# Patient Record
Sex: Female | Born: 1980 | Race: White | Hispanic: No | Marital: Single | Smoking: Current every day smoker
Health system: Southern US, Community
[De-identification: ages and names within clinical notes are randomized; demographics above are authoritative.]

## PROBLEM LIST (undated history)

## (undated) DIAGNOSIS — I10 Essential (primary) hypertension: Secondary | ICD-10-CM

## (undated) DIAGNOSIS — E785 Hyperlipidemia, unspecified: Secondary | ICD-10-CM

## (undated) HISTORY — DX: Essential (primary) hypertension: I10

## (undated) HISTORY — DX: Hyperlipidemia, unspecified: E78.5

## (undated) HISTORY — PX: COLON SURGERY: SHX602

---

## 2015-09-27 ENCOUNTER — Encounter: Payer: Self-pay | Admitting: Endocrinology

## 2016-04-03 ENCOUNTER — Encounter: Payer: Self-pay | Admitting: Endocrinology

## 2017-04-09 ENCOUNTER — Encounter: Payer: Self-pay | Admitting: Family Medicine

## 2017-04-09 ENCOUNTER — Ambulatory Visit (INDEPENDENT_AMBULATORY_CARE_PROVIDER_SITE_OTHER): Payer: BLUE CROSS/BLUE SHIELD | Admitting: Family Medicine

## 2017-04-09 VITALS — BP 128/68 | HR 89 | Temp 98.3°F | Resp 16 | Ht 62.75 in | Wt 213.4 lb

## 2017-04-09 DIAGNOSIS — N898 Other specified noninflammatory disorders of vagina: Secondary | ICD-10-CM | POA: Diagnosis not present

## 2017-04-09 DIAGNOSIS — Z6838 Body mass index (BMI) 38.0-38.9, adult: Secondary | ICD-10-CM | POA: Diagnosis not present

## 2017-04-09 DIAGNOSIS — Z131 Encounter for screening for diabetes mellitus: Secondary | ICD-10-CM | POA: Diagnosis not present

## 2017-04-09 DIAGNOSIS — Z124 Encounter for screening for malignant neoplasm of cervix: Secondary | ICD-10-CM

## 2017-04-09 DIAGNOSIS — E782 Mixed hyperlipidemia: Secondary | ICD-10-CM | POA: Diagnosis not present

## 2017-04-09 DIAGNOSIS — R5382 Chronic fatigue, unspecified: Secondary | ICD-10-CM

## 2017-04-09 DIAGNOSIS — E041 Nontoxic single thyroid nodule: Secondary | ICD-10-CM

## 2017-04-09 DIAGNOSIS — Z0000 Encounter for general adult medical examination without abnormal findings: Secondary | ICD-10-CM

## 2017-04-09 LAB — POCT WET + KOH PREP
Trich by wet prep: ABSENT
YEAST BY KOH: ABSENT
Yeast by wet prep: ABSENT

## 2017-04-09 MED ORDER — HYDROCHLOROTHIAZIDE 25 MG PO TABS: 25.0000 mg | ORAL_TABLET | Freq: Every day | ORAL | 1 refills | Status: DC

## 2017-04-09 MED ORDER — ROSUVASTATIN CALCIUM 20 MG PO TABS: 20.0000 mg | ORAL_TABLET | Freq: Every day | ORAL | 1 refills | Status: DC

## 2017-04-09 MED ORDER — CARVEDILOL 3.125 MG PO TABS: 3.1250 mg | ORAL_TABLET | Freq: Two times a day (BID) | ORAL | 90 refills | Status: DC

## 2017-04-09 MED ORDER — CETIRIZINE HCL 10 MG PO TABS: 10.0000 mg | ORAL_TABLET | Freq: Every day | ORAL | 3 refills | Status: AC

## 2017-04-09 NOTE — Patient Instructions (Signed)
     IF you received an x-ray today, you will receive an invoice from [facility]. Please contact [facility] at [phone] with questions or concerns regarding your invoice.   IF you received labwork today, you will receive an invoice from [facility]. Please contact [facility] at [phone] with questions or concerns regarding your invoice.   Our billing staff will not be able to assist you with questions regarding bills from these companies.  You will be contacted with the lab results as soon as they are available. The fastest way to get your results is to activate your My Chart account. Instructions are located on the last page of this paperwork. If you have not heard from us regarding the results in 2 weeks, please contact this office.    We recommend that you schedule a mammogram for breast cancer screening. Typically, you do not need a referral to do this. Please contact a local imaging center to schedule your mammogram.  [facility] - [phone]  *ask for the Radiology Department [facility] - [phone] or [phone]  [facility] - [phone] [facility] - [phone] [facility] - [phone]  *ask for the Radiology Department [facility] - [phone]  *ask for the Radiology Department [facility] - [phone]  *ask for the Mammography Department [facility] - [phone] 

## 2017-04-09 NOTE — Progress Notes (Signed)
Chief Complaint  Patient presents with  . Annual Exam    with pap    Subjective:  Monique Bowers is a 36 y.o. female here for a health maintenance visit.  Patient is new pt   Patient has a history of a thyroid nodule  She had annual [place]  She also had 2 biopsies from endocrinology She reports fatigue and heat intolerance as well as cold intolerance Her fatigue is not severe She reports that she gets tired monthly for about a week Patient's last menstrual period was 03/28/2017. She gets regular periods lasting 4-5 days She reports that her sexual partner was not monogamous so she wants a repeat pap [facility]   Patient Active Problem List   Diagnosis Date Noted  . Thyroid nodule 04/09/2017    Past Medical History:  Diagnosis Date  . Hyperlipidemia   . Hypertension     Past Surgical History:  Procedure Laterality Date  . CESAREAN SECTION    . COLON SURGERY       No outpatient prescriptions prior to visit.   No facility-administered medications prior to visit.     No Known Allergies   Family History  Problem Relation Age of Onset  . Hyperlipidemia Father   . Hypertension Father   . Gout Sister   . Diabetes Maternal Grandmother   . Hypertension Maternal Grandmother   . Stroke Maternal Grandfather   . Diabetes Paternal Grandmother   . Hypertension Paternal Grandmother   . Heart disease Paternal Grandfather      Health Habits: Dental Exam: up to date Eye Exam: up to date Exercise: 2 times/week on average Current exercise activities: walking/running Diet:   Social History   Social History  . Marital status: Single    Spouse name: N/A  . Number of children: N/A  . Years of education: N/A   Occupational History  . Not on file.   Social History Main Topics  . Smoking status: Current Every Day Smoker    Packs/day: 0.50    Years: 20.00    Types: Cigarettes  . Smokeless tobacco: Never Used  .  Alcohol use 0.6 oz/week    1 Glasses of wine per week  . Drug use: No  . Sexual activity: Not Currently   Other Topics Concern  . Not on file   Social History Narrative  . No narrative on file   History  Alcohol Use  . 0.6 oz/week  . 1 Glasses of wine per week   History  Smoking Status  . Current Every Day Smoker  . Packs/day: 0.50  . Years: 20.00  . Types: Cigarettes  Smokeless Tobacco  . Never Used   History  Drug Use No    GYN: Sexual Health Menstrual status: regular menses LMP: Patient's last menstrual period was 03/28/2017. Last pap smear: see HM section History of abnormal pap smears: none Sexually active: with female partner Current contraception: abstinence, condoms with intercourse  Health Maintenance: See under health Maintenance activity for review of completion dates as well.  There is no immunization history on file for this patient.    Depression Screen-PHQ2/9 Depression screen [facility] 2/9 04/09/2017  Decreased Interest 0  Down, Depressed, Hopeless 0  PHQ - 2 Score 0       Depression Severity and Treatment Recommendations:  0-4= None  5-9= Mild / Treatment: Support, educate to call if worse; return in one month  10-14= Moderate / Treatment: Support,  watchful waiting; Antidepressant or Psycotherapy  15-19= Moderately severe / Treatment: Antidepressant OR Psychotherapy  >= 20 = Major depression, severe / Antidepressant AND Psychotherapy    Review of Systems   Review of Systems  Constitutional: Positive for malaise/fatigue.  Respiratory: Negative for cough and hemoptysis.   Cardiovascular: Negative for chest pain and palpitations.  Gastrointestinal: Negative for abdominal pain, nausea and vomiting.  Genitourinary: Negative for dysuria, frequency and urgency.  Skin: Negative for itching and rash.  Neurological: Positive for headaches. Negative for dizziness, sensory change and seizures.  Endo/Heme/Allergies:       +fatigue, cold  intolerance, heat intolerance    See HPI for ROS as well.    Objective:   Vitals:   04/09/17 0830  BP: 128/68  Pulse: 89  Resp: 16  Temp: 98.3 F (36.8 C)  TempSrc: Oral  SpO2: 97%  Weight: 213 lb 6.4 oz (96.8 kg)  Height: 5' 2.75" (1.594 m)    Body mass index is 38.1 kg/m.  Physical Exam  Constitutional: She is oriented to person, place, and time. She appears well-developed and well-nourished.  HENT:  Head: Normocephalic and atraumatic.  Eyes: Conjunctivae and EOM are normal.  Neck: Normal range of motion. Neck supple. Thyromegaly present.  Left side thyroid gland with nodule  Cardiovascular: Normal rate, regular rhythm and normal heart sounds.   Pulmonary/Chest: Effort normal and breath sounds normal. No respiratory distress. She has no wheezes.  Abdominal: Soft. Bowel sounds are normal. She exhibits no distension. There is no tenderness. There is no rebound.  Genitourinary: Vagina normal.  Musculoskeletal: Normal range of motion. She exhibits no edema or tenderness.  Neurological: She is alert and oriented to person, place, and time. She has normal reflexes. No cranial nerve deficit.  Skin: Skin is warm. No erythema.  Psychiatric: She has a normal mood and affect. Her behavior is normal. Judgment and thought content normal.    Vaginal exam Labia normal bilaterally without skin lesions Urethral meatus normal appearing without erythema Vagina with white discharge No CMT, ovaries small and not palpable Uterus midline, nontender Pap smear performed Chaperone present   Assessment/Plan:   Patient was seen for a health maintenance exam.  Counseled the patient on health maintenance issues. Reviewed her health mainteance schedule and ordered appropriate tests (see orders.) Counseled on regular exercise and weight management. Recommend regular eye exams and dental cleaning.   The following issues were addressed today for health maintenance:   Monique Bowers was seen today  for annual exam.  Diagnoses and all orders for this visit:  Encounter for health maintenance examination in adult- age appropriate screenings reviewed  Pap smear for cervical cancer screening -     Pap IG and HPV (high risk) DNA detection  Thyroid nodule- will monitor and refer -     Ambulatory referral to Endocrinology -     TSH + free T4  Class 2 severe obesity due to excess calories with serious comorbidity and body mass index (BMI) of 38.0 to 38.9 in adult (HCC) -     Comprehensive metabolic panel -     TSH + free T4 -     Hemoglobin A1c  Chronic fatigue- will assess for underlying causes -     TSH + free T4 -     CBC  Mixed hyperlipidemia -     Comprehensive metabolic panel -     Lipid panel  Screening for diabetes mellitus -     Hemoglobin A1c  Vaginal discharge- physiologic discharge.  No yeast or clue cells -     POCT Wet + KOH Prep  Other orders -     rosuvastatin (CRESTOR) 20 MG tablet; Take 1 tablet (20 mg total) by mouth daily. -     hydrochlorothiazide (HYDRODIURIL) 25 MG tablet; Take 1 tablet (25 mg total) by mouth daily. -     cetirizine (ZYRTEC) 10 MG tablet; Take 1 tablet (10 mg total) by mouth daily. -     carvedilol (COREG) 3.125 MG tablet; Take 1 tablet (3.125 mg total) by mouth 2 (two) times daily with a meal.    Return in about 1 year (around 04/09/2018).    Body mass index is 38.1 kg/m.:  Discussed the patient's BMI with patient. The BMI body mass index is 38.1 kg/m.     No future appointments.  Patient Instructions       IF you received an x-ray today, you will receive an invoice from [facility]. Please contact [facility] at [phone] with questions or concerns regarding your invoice.   IF you received labwork today, you will receive an invoice from [facility]. Please contact [facility] at [phone] with questions or concerns regarding your invoice.   Our billing staff will not be able to assist you with  questions regarding bills from these companies.  You will be contacted with the lab results as soon as they are available. The fastest way to get your results is to activate your My Chart account. Instructions are located on the last page of this paperwork. If you have not heard from us regarding the results in 2 weeks, please contact this office.    We recommend that you schedule a mammogram for breast cancer screening. Typically, you do not need a referral to do this. Please contact a local imaging center to schedule your mammogram.  [facility] - [phone]  *ask for the Radiology Department [facility] - [phone] or [phone]  [facility] - [phone] [facility] - [phone] [facility] - [phone]  *ask for the Radiology Department [facility] - [phone]  *ask for the Radiology Department [facility] - [phone]  *ask for the Mammography Department [facility] - [phone]

## 2017-04-10 LAB — COMPREHENSIVE METABOLIC PANEL
ALBUMIN: 4.5 g/dL (ref 3.5–5.5)
ALK PHOS: 53 IU/L (ref 39–117)
ALT: 28 IU/L (ref 0–32)
AST: 22 IU/L (ref 0–40)
Albumin/Globulin Ratio: 1.7 (ref 1.2–2.2)
BILIRUBIN TOTAL: 0.3 mg/dL (ref 0.0–1.2)
BUN / CREAT RATIO: 20 (ref 9–23)
BUN: 12 mg/dL (ref 6–20)
CO2: 22 mmol/L (ref 20–29)
CREATININE: 0.59 mg/dL (ref 0.57–1.00)
Calcium: 9.8 mg/dL (ref 8.7–10.2)
Chloride: 102 mmol/L (ref 96–106)
GFR calc non Af Amer: 117 mL/min/{1.73_m2} (ref >59)
GFR, EST AFRICAN AMERICAN: 134 mL/min/{1.73_m2} (ref >59)
GLOBULIN, TOTAL: 2.6 g/dL (ref 1.5–4.5)
GLUCOSE: 104 mg/dL — AB (ref 65–99)
Potassium: 3.7 mmol/L (ref 3.5–5.2)
SODIUM: 142 mmol/L (ref 134–144)
TOTAL PROTEIN: 7.1 g/dL (ref 6.0–8.5)

## 2017-04-10 LAB — LIPID PANEL
CHOL/HDL RATIO: 5.6 ratio — AB (ref 0.0–4.4)
Cholesterol, Total: 195 mg/dL (ref 100–199)
HDL: 35 mg/dL — ABNORMAL LOW (ref >39)
LDL CALC: 114 mg/dL — AB (ref 0–99)
TRIGLYCERIDES: 231 mg/dL — AB (ref 0–149)
VLDL Cholesterol Cal: 46 mg/dL — ABNORMAL HIGH (ref 5–40)

## 2017-04-10 LAB — CBC
HEMATOCRIT: 41.3 % (ref 34.0–46.6)
HEMOGLOBIN: 14 g/dL (ref 11.1–15.9)
MCH: 31.3 pg (ref 26.6–33.0)
MCHC: 33.9 g/dL (ref 31.5–35.7)
MCV: 92 fL (ref 79–97)
Platelets: 343 10*3/uL (ref 150–379)
RBC: 4.48 x10E6/uL (ref 3.77–5.28)
RDW: 13.2 % (ref 12.3–15.4)
WBC: 9.7 10*3/uL (ref 3.4–10.8)

## 2017-04-10 LAB — TSH+FREE T4
Free T4: 0.87 ng/dL (ref 0.82–1.77)
TSH: 0.671 u[IU]/mL (ref 0.450–4.500)

## 2017-04-10 LAB — HEMOGLOBIN A1C
Est. average glucose Bld gHb Est-mCnc: 103 mg/dL
Hgb A1c MFr Bld: 5.2 % (ref 4.8–5.6)

## 2017-04-11 LAB — PAP IG AND HPV HIGH-RISK
HPV, HIGH-RISK: NEGATIVE
PAP Smear Comment: 0

## 2017-04-15 ENCOUNTER — Telehealth: Payer: Self-pay | Admitting: Family Medicine

## 2017-04-15 NOTE — Telephone Encounter (Signed)
PT CALLING ABOUT ABNORMAL PAP

## 2017-04-15 NOTE — Telephone Encounter (Signed)
Pt is looking for lab results   Best number [phone]

## 2017-04-16 NOTE — Telephone Encounter (Signed)
Please advise 

## 2017-04-17 NOTE — Telephone Encounter (Signed)
Discussed results with the patient

## 2017-05-08 ENCOUNTER — Encounter: Payer: Self-pay | Admitting: Emergency Medicine

## 2017-05-08 ENCOUNTER — Ambulatory Visit (INDEPENDENT_AMBULATORY_CARE_PROVIDER_SITE_OTHER): Payer: BLUE CROSS/BLUE SHIELD | Admitting: Emergency Medicine

## 2017-05-08 VITALS — BP 121/80 | HR 76 | Temp 98.3°F | Resp 16 | Ht 63.0 in | Wt 209.3 lb

## 2017-05-08 DIAGNOSIS — R350 Frequency of micturition: Secondary | ICD-10-CM | POA: Diagnosis not present

## 2017-05-08 DIAGNOSIS — R399 Unspecified symptoms and signs involving the genitourinary system: Secondary | ICD-10-CM

## 2017-05-08 LAB — POCT URINALYSIS DIP (MANUAL ENTRY)
Bilirubin, UA: NEGATIVE
GLUCOSE UA: NEGATIVE mg/dL
Ketones, POC UA: NEGATIVE mg/dL
Leukocytes, UA: NEGATIVE
NITRITE UA: NEGATIVE
PROTEIN UA: NEGATIVE mg/dL
RBC UA: NEGATIVE
Spec Grav, UA: <=1.005 — AB (ref 1.010–1.025)
UROBILINOGEN UA: 0.2 U/dL
pH, UA: 6.5 (ref 5.0–8.0)

## 2017-05-08 LAB — POC MICROSCOPIC URINALYSIS (UMFC): MUCUS RE: ABSENT

## 2017-05-08 MED ORDER — SULFAMETHOXAZOLE-TRIMETHOPRIM 800-160 MG PO TABS: 1.0000 | ORAL_TABLET | Freq: Two times a day (BID) | ORAL | 0 refills | Status: AC

## 2017-05-08 NOTE — Progress Notes (Signed)
Monique Bowers 36 y.o.   Chief Complaint  Patient presents with  . Urinary Frequency    WITH BURINING AT THE END x 1 week  . Back Pain    HISTORY OF PRESENT ILLNESS: This is a 36 y.o. female complaining of UTI symptoms x 1 week now getting nauseous with some right sided flank pain. Has been taking Azo and cranbery remedies.  Urinary Tract Infection   This is a new problem. The current episode started in the past 7 days. The problem occurs every urination. The problem has been gradually worsening. The quality of the pain is described as burning. The pain is at a severity of 3/10. The pain is mild. There has been no fever. There is no history of pyelonephritis. Associated symptoms include flank pain, frequency and nausea. Pertinent negatives include no chills, discharge, hematuria, possible pregnancy or vomiting. She has tried home medications for the symptoms. The treatment provided mild relief. There is no history of catheterization, kidney stones, recurrent UTIs or a urological procedure.     Prior to Admission medications   Medication Sig Start Date End Date Taking? Authorizing Provider  carvedilol (COREG) 3.125 MG tablet Take 1 tablet (3.125 mg total) by mouth 2 (two) times daily with a meal. 04/09/17  Yes Stallings, Zoe A, MD  cetirizine (ZYRTEC) 10 MG tablet Take 1 tablet (10 mg total) by mouth daily. 04/09/17  Yes Stallings, Zoe A, MD  hydrochlorothiazide (HYDRODIURIL) 25 MG tablet Take 1 tablet (25 mg total) by mouth daily. 04/09/17  Yes Stallings, Zoe A, MD  rosuvastatin (CRESTOR) 20 MG tablet Take 1 tablet (20 mg total) by mouth daily. 04/09/17  Yes Doristine Bosworth, MD    No Known Allergies  Patient Active Problem List   Diagnosis Date Noted  . Thyroid nodule 04/09/2017    Past Medical History:  Diagnosis Date  . Hyperlipidemia   . Hypertension     Past Surgical History:  Procedure Laterality Date  . CESAREAN SECTION    . COLON SURGERY      Social History   Social  History  . Marital status: Single    Spouse name: N/A  . Number of children: N/A  . Years of education: N/A   Occupational History  . Not on file.   Social History Main Topics  . Smoking status: Current Every Day Smoker    Packs/day: 0.50    Years: 20.00    Types: Cigarettes  . Smokeless tobacco: Never Used  . Alcohol use 0.6 oz/week    1 Glasses of wine per week  . Drug use: No  . Sexual activity: Not Currently   Other Topics Concern  . Not on file   Social History Narrative  . No narrative on file    Family History  Problem Relation Age of Onset  . Hyperlipidemia Father   . Hypertension Father   . Gout Sister   . Diabetes Maternal Grandmother   . Hypertension Maternal Grandmother   . Stroke Maternal Grandfather   . Diabetes Paternal Grandmother   . Hypertension Paternal Grandmother   . Heart disease Paternal Grandfather      Review of Systems  Constitutional: Negative for chills, fever and malaise/fatigue.  HENT: Negative.   Eyes: Negative.   Respiratory: Negative for cough and shortness of breath.   Cardiovascular: Negative for chest pain and palpitations.  Gastrointestinal: Positive for nausea. Negative for abdominal pain, diarrhea and vomiting.  Genitourinary: Positive for dysuria, flank pain and frequency. Negative for  hematuria.  Skin: Negative for rash.  Neurological: Negative.  Negative for dizziness and headaches.  Endo/Heme/Allergies: Negative.   All other systems reviewed and are negative.   Vitals:   05/08/17 1050  BP: 121/80  Pulse: 76  Resp: 16  Temp: 98.3 F (36.8 C)    Physical Exam  Constitutional: She is oriented to person, place, and time. She appears well-developed and well-nourished.  HENT:  Head: Normocephalic and atraumatic.  Eyes: Pupils are equal, round, and reactive to light. Conjunctivae and EOM are normal.  Neck: Normal range of motion.  Cardiovascular: Normal rate, regular rhythm and normal heart sounds.     Pulmonary/Chest: Effort normal and breath sounds normal.  Abdominal: Soft. Bowel sounds are normal. She exhibits no distension. There is no tenderness. There is no CVA tenderness.  Musculoskeletal: Normal range of motion.  Neurological: She is alert and oriented to person, place, and time. No sensory deficit. She exhibits normal muscle tone.  Skin: Skin is warm and dry. Capillary refill takes less than 2 seconds. No rash noted.  Psychiatric: She has a normal mood and affect. Her behavior is normal.  Vitals reviewed.  Results for orders placed or performed in visit on 05/08/17 (from the past 24 hour(s))  POCT urinalysis dipstick     Status: Abnormal   Collection Time: 05/08/17 11:01 AM  Result Value Ref Range   Color, UA yellow yellow   Clarity, UA clear clear   Glucose, UA negative negative mg/dL   Bilirubin, UA negative negative   Ketones, POC UA negative negative mg/dL   Spec Grav, UA <=4.098<=1.005 (A) 1.010 - 1.025   Blood, UA negative negative   pH, UA 6.5 5.0 - 8.0   Protein Ur, POC negative negative mg/dL   Urobilinogen, UA 0.2 0.2 or 1.0 E.U./dL   Nitrite, UA Negative Negative   Leukocytes, UA Negative Negative  POCT Microscopic Urinalysis ([facility])     Status: Abnormal   Collection Time: 05/08/17 11:11 AM  Result Value Ref Range   WBC,UR,HPF,POC None None WBC/hpf   RBC,UR,HPF,POC None None RBC/hpf   Bacteria None None, Too numerous to count   Mucus Absent Absent   Epithelial Cells, UR Per Microscopy Few (A) None, Too numerous to count cells/hpf     ASSESSMENT & PLAN: Monique Bowers was seen today for urinary frequency and back pain.  Diagnoses and all orders for this visit:  Lower urinary tract symptoms (LUTS)  Urinary frequency -     POCT Microscopic Urinalysis ([facility]) -     POCT urinalysis dipstick -     Urine Culture  Other orders -     sulfamethoxazole-trimethoprim (BACTRIM DS,SEPTRA DS) 800-160 MG tablet; Take 1 tablet by mouth 2 (two) times daily.    Patient  Instructions       IF you received an x-ray today, you will receive an invoice from [facility]. Please contact [facility] at [phone] with questions or concerns regarding your invoice.   IF you received labwork today, you will receive an invoice from [facility]. Please contact [facility] at [phone] with questions or concerns regarding your invoice.   Our billing staff will not be able to assist you with questions regarding bills from these companies.  You will be contacted with the lab results as soon as they are available. The fastest way to get your results is to activate your My Chart account. Instructions are located on the last page of this paperwork. If you have not heard from us regarding the results  in 2 weeks, please contact this office.     Urinary Tract Infection, Adult A urinary tract infection (UTI) is an infection of any part of the urinary tract. The urinary tract includes the:  Kidneys.  Ureters.  Bladder.  Urethra.  These organs make, store, and get rid of pee (urine) in the body. Follow these instructions at home:  Take over-the-counter and prescription medicines only as told by your doctor.  If you were prescribed an antibiotic medicine, take it as told by your doctor. Do not stop taking the antibiotic even if you start to feel better.  Avoid the following drinks: ? Alcohol. ? Caffeine. ? Tea. ? Carbonated drinks.  Drink enough fluid to keep your pee clear or pale yellow.  Keep all follow-up visits as told by your doctor. This is important.  Make sure to: ? Empty your bladder often and completely. Do not to hold pee for long periods of time. ? Empty your bladder before and after sex. ? Wipe from front to back after a bowel movement if you are female. Use each tissue one time when you wipe. Contact a doctor if:  You have back pain.  You have a fever.  You feel sick to your stomach (nauseous).  You throw up  (vomit).  Your symptoms do not get better after 3 days.  Your symptoms go away and then come back. Get help right away if:  You have very bad back pain.  You have very bad lower belly (abdominal) pain.  You are throwing up and cannot keep down any medicines or water. This information is not intended to replace advice given to you by your health care provider. Make sure you discuss any questions you have with your health care provider. Document Released: 03/26/2008 Document Revised: 03/15/2016 Document Reviewed: 08/29/2015 Elsevier Interactive Patient Education  2018 Elsevier Inc.      Edwina Barth, MD [facility]

## 2017-05-08 NOTE — Patient Instructions (Addendum)
     IF you received an x-ray today, you will receive an invoice from [facility]. Please contact [facility] at [phone] with questions or concerns regarding your invoice.   IF you received labwork today, you will receive an invoice from [facility]. Please contact [facility] at [phone] with questions or concerns regarding your invoice.   Our billing staff will not be able to assist you with questions regarding bills from these companies.  You will be contacted with the lab results as soon as they are available. The fastest way to get your results is to activate your My Chart account. Instructions are located on the last page of this paperwork. If you have not heard from us regarding the results in 2 weeks, please contact this office.     Urinary Tract Infection, Adult A urinary tract infection (UTI) is an infection of any part of the urinary tract. The urinary tract includes the:  Kidneys.  Ureters.  Bladder.  Urethra.  These organs make, store, and get rid of pee (urine) in the body. Follow these instructions at home:  Take over-the-counter and prescription medicines only as told by your doctor.  If you were prescribed an antibiotic medicine, take it as told by your doctor. Do not stop taking the antibiotic even if you start to feel better.  Avoid the following drinks: ? Alcohol. ? Caffeine. ? Tea. ? Carbonated drinks.  Drink enough fluid to keep your pee clear or pale yellow.  Keep all follow-up visits as told by your doctor. This is important.  Make sure to: ? Empty your bladder often and completely. Do not to hold pee for long periods of time. ? Empty your bladder before and after sex. ? Wipe from front to back after a bowel movement if you are female. Use each tissue one time when you wipe. Contact a doctor if:  You have back pain.  You have a fever.  You feel sick to your stomach (nauseous).  You throw up (vomit).  Your symptoms do  not get better after 3 days.  Your symptoms go away and then come back. Get help right away if:  You have very bad back pain.  You have very bad lower belly (abdominal) pain.  You are throwing up and cannot keep down any medicines or water. This information is not intended to replace advice given to you by your health care provider. Make sure you discuss any questions you have with your health care provider. Document Released: 03/26/2008 Document Revised: 03/15/2016 Document Reviewed: 08/29/2015 Elsevier Interactive Patient Education  2018 Elsevier Inc.  

## 2017-05-09 LAB — URINE CULTURE

## 2017-05-31 ENCOUNTER — Encounter: Payer: Self-pay | Admitting: Family Medicine

## 2017-05-31 ENCOUNTER — Ambulatory Visit (INDEPENDENT_AMBULATORY_CARE_PROVIDER_SITE_OTHER): Payer: BLUE CROSS/BLUE SHIELD | Admitting: Family Medicine

## 2017-05-31 VITALS — BP 129/83 | HR 82 | Temp 98.8°F | Resp 16 | Ht 62.0 in | Wt 207.0 lb

## 2017-05-31 DIAGNOSIS — Z202 Contact with and (suspected) exposure to infections with a predominantly sexual mode of transmission: Secondary | ICD-10-CM | POA: Diagnosis not present

## 2017-05-31 NOTE — Progress Notes (Signed)
  Chief Complaint  Patient presents with  . Exposure to STD    PER PATIENT FOUND OUT YESTERDAY    HPI  Pt reports that  She was notified that her last sexual partner had HSV. He contacted her on facebook to notify her of his positive status He reports that [facility] but was not tested for HSV She denies vaginal discharge, vaginal skin lesions She last had sexual intercourse with this partner 02/18/2017 She denies fevers or chills. No unexplained weight loss. Her previous std testing has been negative.  Past Medical History:  Diagnosis Date  . Hyperlipidemia   . Hypertension     Current Outpatient Prescriptions  Medication Sig Dispense Refill  . carvedilol (COREG) 3.125 MG tablet Take 1 tablet (3.125 mg total) by mouth 2 (two) times daily with a meal. 180 tablet 90  . cetirizine (ZYRTEC) 10 MG tablet Take 1 tablet (10 mg total) by mouth daily. 90 tablet 3  . hydrochlorothiazide (HYDRODIURIL) 25 MG tablet Take 1 tablet (25 mg total) by mouth daily. 90 tablet 1  . rosuvastatin (CRESTOR) 20 MG tablet Take 1 tablet (20 mg total) by mouth daily. 90 tablet 1   No current facility-administered medications for this visit.     Allergies: No Known Allergies  Past Surgical History:  Procedure Laterality Date  . CESAREAN SECTION    . COLON SURGERY      Social History   Social History  . Marital status: Single    Spouse name: N/A  . Number of children: N/A  . Years of education: N/A   Social History Main Topics  . Smoking status: Current Every Day Smoker    Packs/day: 0.50    Years: 20.00    Types: Cigarettes  . Smokeless tobacco: Never Used  . Alcohol use 0.6 oz/week    1 Glasses of wine per week  . Drug use: No  . Sexual activity: Not Currently   Other Topics Concern  . None   Social History Narrative  . None    ROS See hpi  Objective: Vitals:   05/31/17 1434  BP: 129/83  Pulse: 82  Resp: 16  Temp: 98.8 F (37.1 C)   TempSrc: Oral  SpO2: 96%  Weight: 207 lb (93.9 kg)  Height: 5\' 2"  (1.575 m)    Physical Exam  Constitutional: She is oriented to person, place, and time. She appears well-developed and well-nourished.  HENT:  Head: Normocephalic and atraumatic.  Cardiovascular: Normal rate, regular rhythm and normal heart sounds.   Pulmonary/Chest: Effort normal and breath sounds normal. No respiratory distress. She has no wheezes.  Neurological: She is alert and oriented to person, place, and time.  Psychiatric: She has a normal mood and affect. Her behavior is normal. Judgment and thought content normal.    Assessment and Plan Marchelle Folksmanda was seen today for exposure to std.  Diagnoses and all orders for this visit:  Exposure to sexually transmitted disease (STD)- will rescreen today for stds and add hsv testing Explained what the antibody test mean Since pt has no lesions will perform antibody testing for hsv -     GC/Chlamydia Probe Amp -     HSV(herpes simplex vrs) 1+2 ab-IgG -     HIV antibody -     Hepatitis B surface antigen -     RPR      Zaidin Blyden A Raife Lizer

## 2017-05-31 NOTE — Patient Instructions (Addendum)
   IF you received an x-ray today, you will receive an invoice from [facility]. Please contact [facility] at [phone] with questions or concerns regarding your invoice.   IF you received labwork today, you will receive an invoice from [facility]. Please contact [facility] at [phone] with questions or concerns regarding your invoice.   Our billing staff will not be able to assist you with questions regarding bills from these companies.  You will be contacted with the lab results as soon as they are available. The fastest way to get your results is to activate your My Chart account. Instructions are located on the last page of this paperwork. If you have not heard from us regarding the results in 2 weeks, please contact this office.      Genital Herpes Genital herpes is a common sexually transmitted infection (STI) that is caused by a virus. The virus spreads from person to person through sexual contact. Infection can cause itching, blisters, and sores around the genitals or rectum. Symptoms may last several days and then go away This is called an outbreak. However, the virus remains in your body, so you may have more outbreaks in the future. The time between outbreaks varies and can be months or years. Genital herpes affects men and women. It is particularly concerning for pregnant women because the virus can be passed to the baby during delivery and can cause serious problems. Genital herpes is also a concern for people who have a weak disease-fighting (immune) system. What are the causes? This condition is caused by the herpes simplex virus (HSV) type 1 or type 2. The virus may spread through:  Sexual contact with an infected person, including vaginal, anal, and oral sex.  Contact with fluid from a herpes sore.  The skin. This means that you can get herpes from an infected partner even if he or she does not have a visible sore or does not know that he or she is  infected.  What increases the risk? You are more likely to develop this condition if:  You have sex with many partners.  You do not use latex condoms during sex.  What are the signs or symptoms? Most people do not have symptoms (asymptomatic) or have mild symptoms that may be mistaken for other skin problems. Symptoms may include:  Small red bumps near the genitals, rectum, or mouth. These bumps turn into blisters and then turn into sores.  Flu-like symptoms, including: ? Fever. ? Body aches. ? Swollen lymph nodes. ? Headache.  Painful urination.  Pain and itching in the genital area or rectal area.  Vaginal discharge.  Tingling or shooting pain in the legs and buttocks.  Generally, symptoms are more severe and last longer during the first (primary) outbreak. Flu-like symptoms are also more common during the primary outbreak. How is this diagnosed? Genital herpes may be diagnosed based on:  A physical exam.  Your medical history.  Blood tests.  A test of a fluid sample (culture) from an open sore.  How is this treated? There is no cure for this condition, but treatment with antiviral medicines that are taken by mouth (orally) can do the following:  Speed up healing and relieve symptoms.  Help to reduce the spread of the virus to sexual partners.  Limit the chance of future outbreaks, or make future outbreaks shorter.  Lessen symptoms of future outbreaks.  Your health care provider may also recommend pain relief medicines, such as aspirin or ibuprofen.   Follow these instructions at home: Sexual activity  Do not have sexual contact during active outbreaks.  Practice safe sex. Latex condoms and female condoms may help prevent the spread of the herpes virus. General instructions  Keep the affected areas dry and clean.  Take over-the-counter and prescription medicines only as told by your health care provider.  Avoid rubbing or touching blisters and sores. If  you do touch blisters or sores: ? Wash your hands thoroughly with soap and water. ? Do not touch your eyes afterward.  To help relieve pain or itching, you may take the following actions as directed by your health care provider: ? Apply a cold, wet cloth (cold compress) to affected areas 4-6 times a day. ? Apply a substance that protects your skin and reduces bleeding (astringent). ? Apply a gel that helps relieve pain around sores (lidocaine gel). ? Take a warm, shallow bath that cleans the genital area (sitz bath).  Keep all follow-up visits as told by your health care provider. This is important. How is this prevented?  Use condoms. Although anyone can get genital herpes during sexual contact, even with the use of a condom, a condom can provide some protection.  Avoid having multiple sexual partners.  Talk with your sexual partner about any symptoms either of you may have. Also, talk with your partner about any history of STIs.  Get tested for STIs before you have sex. Ask your partner to do the same.  Do not have sexual contact if you have symptoms of genital herpes. Contact a health care provider if:  Your symptoms are not improving with medicine.  Your symptoms return.  You have new symptoms.  You have a fever.  You have abdominal pain.  You have redness, swelling, or pain in your eye.  You notice new sores on other parts of your body.  You are a woman and experience bleeding between menstrual periods.  You have had herpes and you become pregnant or plan to become pregnant. Summary  Genital herpes is a common sexually transmitted infection (STI) that is caused by the herpes simplex virus (HSV) type 1 or type 2.  These viruses are most often spread through sexual contact with an infected person.  You are more likely to develop this condition if you have sex with many partners or you have unprotected sex.  Most people do not have symptoms (asymptomatic) or have  mild symptoms that may be mistaken for other skin problems. Symptoms occur as outbreaks that may happen months or years apart.  There is no cure for this condition, but treatment with oral antiviral medicines can reduce symptoms, reduce the chance of spreading the virus to a partner, prevent future outbreaks, or shorten future outbreaks. This information is not intended to replace advice given to you by your health care provider. Make sure you discuss any questions you have with your health care provider. Document Released: 10/05/2000 Document Revised: 09/07/2016 Document Reviewed: 09/07/2016 Elsevier Interactive Patient Education  2017 [employer]  

## 2017-06-01 LAB — HEPATITIS B SURFACE ANTIGEN: HEP B S AG: NEGATIVE

## 2017-06-01 LAB — HSV(HERPES SIMPLEX VRS) I + II AB-IGG

## 2017-06-01 LAB — RPR: RPR: NONREACTIVE

## 2017-06-01 LAB — HIV ANTIBODY (ROUTINE TESTING W REFLEX): HIV Screen 4th Generation wRfx: NONREACTIVE

## 2017-06-02 LAB — GC/CHLAMYDIA PROBE AMP
CHLAMYDIA, DNA PROBE: NEGATIVE
NEISSERIA GONORRHOEAE BY PCR: NEGATIVE

## 2017-06-05 ENCOUNTER — Ambulatory Visit: Payer: Self-pay | Admitting: Endocrinology

## 2017-06-14 ENCOUNTER — Ambulatory Visit (INDEPENDENT_AMBULATORY_CARE_PROVIDER_SITE_OTHER): Payer: BLUE CROSS/BLUE SHIELD | Admitting: Endocrinology

## 2017-06-14 ENCOUNTER — Encounter: Payer: Self-pay | Admitting: Endocrinology

## 2017-06-14 VITALS — BP 130/82 | HR 81 | Wt 208.4 lb

## 2017-06-14 DIAGNOSIS — E041 Nontoxic single thyroid nodule: Secondary | ICD-10-CM

## 2017-06-14 NOTE — Progress Notes (Signed)
Subjective:    Patient ID: Monique Bowers, female    DOB: 29-May-1981, 36 y.o.   MRN: [id number]  HPI Pt is referred by Dr Creta Levin, for nodular thyroid.  Pt was noted to have a multinodular thyroid in 2014.  she has no h/o XRT or surgery to the neck.  She has had several biopsies--she was told results were benign.  She has slight swelling at the ant neck, and assoc "choking" sensation.    Past Medical History:  Diagnosis Date  . Hyperlipidemia   . Hypertension     Past Surgical History:  Procedure Laterality Date  . CESAREAN SECTION    . COLON SURGERY      Social History   Social History  . Marital status: Single    Spouse name: N/A  . Number of children: N/A  . Years of education: N/A   Occupational History  . Not on file.   Social History Main Topics  . Smoking status: Current Every Day Smoker    Packs/day: 0.50    Years: 20.00    Types: Cigarettes  . Smokeless tobacco: Never Used  . Alcohol use 0.6 oz/week    1 Glasses of wine per week  . Drug use: No  . Sexual activity: Not Currently   Other Topics Concern  . Not on file   Social History Narrative  . No narrative on file    Current Outpatient Prescriptions on File Prior to Visit  Medication Sig Dispense Refill  . carvedilol (COREG) 3.125 MG tablet Take 1 tablet (3.125 mg total) by mouth 2 (two) times daily with a meal. 180 tablet 90  . cetirizine (ZYRTEC) 10 MG tablet Take 1 tablet (10 mg total) by mouth daily. 90 tablet 3  . hydrochlorothiazide (HYDRODIURIL) 25 MG tablet Take 1 tablet (25 mg total) by mouth daily. 90 tablet 1  . rosuvastatin (CRESTOR) 20 MG tablet Take 1 tablet (20 mg total) by mouth daily. 90 tablet 1   No current facility-administered medications on file prior to visit.     No Known Allergies  Family History  Problem Relation Age of Onset  . Thyroid nodules Mother   . Hyperlipidemia Father   . Hypertension Father   . Gout Sister   . Diabetes Maternal Grandmother   .  Hypertension Maternal Grandmother   . Stroke Maternal Grandfather   . Diabetes Paternal Grandmother   . Hypertension Paternal Grandmother   . Heart disease Paternal Grandfather     BP 130/82   Pulse 81   Wt 208 lb 6.4 oz (94.5 kg)   LMP 05/17/2017   SpO2 97%   BMI 38.12 kg/m    Review of Systems Denies weight change, hoarseness, neck pain, diplopia, chest pain, sob, cough, dysphagia, diarrhea, itching, flushing, easy bruising, depression, cold intolerance, headache, and rhinorrhea.  She has intermitt fatigue, acral numbness, and hair loss on the head.     Objective:   Physical Exam VS: see vs page GEN: no distress HEAD: head: no deformity eyes: no periorbital swelling, no proptosis.  external nose and ears are normal mouth: no lesion seen NECK: 4-5 cm LLP thyroid nodule is easily palpable.  CHEST WALL: no deformity LUNGS: clear to auscultation.  CV: reg rate and rhythm, no murmur ABD: abdomen is soft, nontender.  no hepatosplenomegaly.  not distended.  no hernia MUSCULOSKELETAL: muscle bulk and strength are grossly normal.  no obvious joint swelling.  gait is normal and steady EXTEMITIES: no deformity.  no ulcer on  the feet.  feet are of normal color and temp.  no edema PULSES: dorsalis pedis intact bilat.  no carotid bruit NEURO:  cn 2-12 grossly intact.   readily moves all 4's.  sensation is intact to touch on the feet SKIN:  Normal texture and temperature.  No rash or suspicious lesion is visible.   NODES:  None palpable at the neck PSYCH: alert, well-oriented.  Does not appear anxious nor depressed.   Lab Results  Component Value Date   TSH 0.671 04/09/2017      Assessment & Plan:  Multinodular goiter: usually hereditary. Fatigue and hair loss, new to me.  Not thyroid-related  Patient Instructions  Please sign a release of information from [facility]. Based on the results, I'll offer you the option of redoing the ultrasound now, vs waiting for next year.   No  medication is needed now.   most of the time, a "lumpy thyroid" will eventually become overactive.  this is usually a slow process, happening over the span of many years.   Please return in 1 year.

## 2017-06-14 NOTE — Patient Instructions (Addendum)
Please sign a release of information from [facility]. Based on the results, I'll offer you the option of redoing the ultrasound now, vs waiting for next year.   No medication is needed now.   most of the time, a "lumpy thyroid" will eventually become overactive.  this is usually a slow process, happening over the span of many years.   Please return in 1 year.

## 2017-06-20 ENCOUNTER — Telehealth: Payer: Self-pay | Admitting: Endocrinology

## 2017-06-20 NOTE — Telephone Encounter (Signed)
[facility] called to inquire about results needing to be mailed to [facility]. Call number provided for additional questions. I provided the address for [facility].

## 2017-06-21 ENCOUNTER — Telehealth: Payer: Self-pay | Admitting: Endocrinology

## 2017-06-21 NOTE — Telephone Encounter (Signed)
Routing to you °

## 2017-06-21 NOTE — Telephone Encounter (Signed)
[facility] & [facility] are faxing labs as well as imaging reports.

## 2017-06-21 NOTE — Telephone Encounter (Signed)
please call patient: I reviewed records from [facility].  In my opinion, it is fine to wait until next year for the ultrasound.

## 2017-06-25 NOTE — Telephone Encounter (Signed)
Called [facility] eft patient detailed VM. I asked her to call back if she had further questions.

## 2017-11-22 ENCOUNTER — Other Ambulatory Visit: Payer: Self-pay | Admitting: Family Medicine

## 2017-12-30 ENCOUNTER — Ambulatory Visit (INDEPENDENT_AMBULATORY_CARE_PROVIDER_SITE_OTHER): Payer: BLUE CROSS/BLUE SHIELD

## 2017-12-30 ENCOUNTER — Other Ambulatory Visit: Payer: Self-pay

## 2017-12-30 ENCOUNTER — Ambulatory Visit (INDEPENDENT_AMBULATORY_CARE_PROVIDER_SITE_OTHER): Payer: BLUE CROSS/BLUE SHIELD | Admitting: Family Medicine

## 2017-12-30 ENCOUNTER — Encounter: Payer: Self-pay | Admitting: Family Medicine

## 2017-12-30 VITALS — BP 108/72 | HR 91 | Temp 98.1°F | Resp 18 | Ht 62.0 in | Wt 176.2 lb

## 2017-12-30 DIAGNOSIS — R300 Dysuria: Secondary | ICD-10-CM

## 2017-12-30 DIAGNOSIS — R1031 Right lower quadrant pain: Secondary | ICD-10-CM | POA: Diagnosis not present

## 2017-12-30 DIAGNOSIS — R319 Hematuria, unspecified: Secondary | ICD-10-CM | POA: Diagnosis not present

## 2017-12-30 DIAGNOSIS — Z87442 Personal history of urinary calculi: Secondary | ICD-10-CM

## 2017-12-30 LAB — POCT CBC
GRANULOCYTE PERCENT: 68 % (ref 37–80)
HCT, POC: 40.2 % (ref 37.7–47.9)
Hemoglobin: 13.6 g/dL (ref 12.2–16.2)
Lymph, poc: 3 (ref 0.6–3.4)
MCH, POC: 30.7 pg (ref 27–31.2)
MCHC: 33.8 g/dL (ref 31.8–35.4)
MCV: 91.1 fL (ref 80–97)
MID (CBC): 0.4 (ref 0–0.9)
MPV: 8.2 fL (ref 0–99.8)
PLATELET COUNT, POC: 315 10*3/uL (ref 142–424)
POC Granulocyte: 7.1 — AB (ref 2–6.9)
POC LYMPH %: 28.4 % (ref 10–50)
POC MID %: 3.6 %M (ref 0–12)
RBC: 4.41 M/uL (ref 4.04–5.48)
RDW, POC: 13.2 %
WBC: 10.5 10*3/uL — AB (ref 4.6–10.2)

## 2017-12-30 LAB — POC MICROSCOPIC URINALYSIS (UMFC): MUCUS RE: ABSENT

## 2017-12-30 LAB — POCT URINALYSIS DIP (MANUAL ENTRY)
BILIRUBIN UA: NEGATIVE
GLUCOSE UA: NEGATIVE mg/dL
Ketones, POC UA: NEGATIVE mg/dL
Leukocytes, UA: NEGATIVE
Nitrite, UA: NEGATIVE
Protein Ur, POC: NEGATIVE mg/dL
SPEC GRAV UA: 1.01 (ref 1.010–1.025)
Urobilinogen, UA: 0.2 E.U./dL
pH, UA: 6 (ref 5.0–8.0)

## 2017-12-30 MED ORDER — SULFAMETHOXAZOLE-TRIMETHOPRIM 800-160 MG PO TABS: 1.0000 | ORAL_TABLET | Freq: Two times a day (BID) | ORAL | 0 refills | Status: DC

## 2017-12-30 NOTE — Progress Notes (Addendum)
Subjective:  This chart was scribed for Shade Flood, MD by Veverly Fells, at Primary Care at [facility].  This patient was seen in room 11 and the patient's care was started at 11:47 AM.   Chief Complaint  Patient presents with  . Dysuria    started 2 weeks had some lower stomach pressure and dysuria now also has an odor and some back pain      Patient ID: Monique Bowers, female    DOB: [birth year], 37 y.o.   MRN: [id number]  HPI HPI Comments: Monique Bowers is a 36 y.o. female who presents to Primary Care at [facility] with a possible urinary tract infection.  She was treated for a urinary infection in July 2018 but had mixed flora at that time on urine culture.  Patients symptoms have been intermittent for the last couple of weeks with lower abdominal pressure, some burning with urination and a "pneumonia odor" to her urine mainly in the mornings. She has associated symptoms of back pain which started a couple days ago. She has had kidney stones in the past (over a year ago), does not have a urologist.   Denies any sores or vaginal discharge.  She has had yeast infections in the past but states that these symptoms are not similar to when she had yeast infections.  Patient has been seen by Dr. Alvy Bimler in the past and states that her symptoms are similar to when she saw him. Patient denies any unprotected intercourse or new sexual partners. She is monogamous, not married.   She denies a history of STIs.   Patient is an attorney (document review).   Patient Active Problem List   Diagnosis Date Noted  . Urinary frequency 05/08/2017  . Lower urinary tract symptoms (LUTS) 05/08/2017  . Thyroid nodule 04/09/2017   Past Medical History:  Diagnosis Date  . Hyperlipidemia   . Hypertension    Past Surgical History:  Procedure Laterality Date  . CESAREAN SECTION    . COLON SURGERY     No Known Allergies Prior to Admission medications   Medication Sig Start Date End Date Taking?  Authorizing Provider  carvedilol (COREG) 3.125 MG tablet Take 1 tablet (3.125 mg total) by mouth 2 (two) times daily with a meal. 04/09/17  Yes Stallings, Zoe A, MD  cetirizine (ZYRTEC) 10 MG tablet Take 1 tablet (10 mg total) by mouth daily. 04/09/17  Yes Stallings, Zoe A, MD  hydrochlorothiazide (HYDRODIURIL) 25 MG tablet TAKE 1 TABLET BY MOUTH ONCE DAILY 11/22/17  Yes Stallings, Zoe A, MD  rosuvastatin (CRESTOR) 20 MG tablet Take 1 tablet (20 mg total) by mouth daily. Patient not taking: Reported on 12/30/2017 04/09/17   Doristine Bosworth, MD   Social History   Socioeconomic History  . Marital status: Single    Spouse name: Not on file  . Number of children: Not on file  . Years of education: Not on file  . Highest education level: Not on file  Social Needs  . Financial resource strain: Not on file  . Food insecurity - worry: Not on file  . Food insecurity - inability: Not on file  . Transportation needs - medical: Not on file  . Transportation needs - non-medical: Not on file  Occupational History  . Not on file  Tobacco Use  . Smoking status: Current Every Day Smoker    Packs/day: 0.50    Years: 20.00    Pack years: 10.00    Types: Cigarettes  .  Smokeless tobacco: Never Used  Substance and Sexual Activity  . Alcohol use: Yes    Alcohol/week: 0.6 oz    Types: 1 Glasses of wine per week  . Drug use: No  . Sexual activity: Not Currently  Other Topics Concern  . Not on file  Social History Narrative  . Not on file   Review of Systems  Constitutional: Negative for chills and fever.  Eyes: Negative for pain, redness and itching.  Gastrointestinal: Negative for nausea and vomiting.       Abdominal pressure.   Genitourinary: Positive for dysuria.  Musculoskeletal: Positive for back pain. Negative for neck pain and neck stiffness.  Neurological: Negative for syncope and speech difficulty.      Objective:   Physical Exam  Constitutional: She is oriented to person, place, and  time. She appears well-developed and well-nourished. No distress.  HENT:  Head: Normocephalic and atraumatic.  Pulmonary/Chest: Effort normal. No respiratory distress.  Abdominal: There is no rebound and no guarding.  Slight discomfort right CVA. Slight tenderness right lower quadrant. Slight suprapubic tenderness.   Neurological: She is alert and oriented to person, place, and time.  Skin: Skin is warm and dry.  Psychiatric: She has a normal mood and affect. Her behavior is normal.    Vitals:   12/30/17 1101  BP: 108/72  Pulse: 91  Resp: 18  Temp: 98.1 F (36.7 C)  TempSrc: Oral  SpO2: 100%  Weight: 176 lb 3.2 oz (79.9 kg)  Height: 5\' 2"  (1.575 m)   Results for orders placed or performed in visit on 12/30/17  POCT urinalysis dipstick  Result Value Ref Range   Color, UA yellow yellow   Clarity, UA clear clear   Glucose, UA negative negative mg/dL   Bilirubin, UA negative negative   Ketones, POC UA negative negative mg/dL   Spec Grav, UA 1.478 2.956 - 1.025   Blood, UA trace-intact (A) negative   pH, UA 6.0 5.0 - 8.0   Protein Ur, POC negative negative mg/dL   Urobilinogen, UA 0.2 0.2 or 1.0 E.U./dL   Nitrite, UA Negative Negative   Leukocytes, UA Negative Negative   Dg Abd 1 View  Result Date: 12/30/2017 CLINICAL DATA:  Hematuria. History of nephrolith. Rule out acute nephrolith. EXAM: ABDOMEN - 1 VIEW COMPARISON:  None. FINDINGS: No evidence of renal or ureteral calculi identified. Bowel gas pattern is nonobstructive. Moderate amount of stool and gas throughout the grossly nondistended colon. No evidence of soft tissue mass or abnormal fluid collection. No evidence of free intraperitoneal air. Cholecystectomy clips in the right upper quadrant. No acute or suspicious osseous finding. IMPRESSION: 1. No renal or ureteral calculi identified. 2. Moderate amount of stool and gas throughout the colon (constipation?). Electronically Signed   By: Bary Richard M.D.   On: 12/30/2017  12:08       Assessment & Plan:    Monique Bowers is a 37 y.o. female Dysuria - Plan: POCT urinalysis dipstick, POCT Microscopic Urinalysis ([facility]), Urine Culture, DG Abd 1 View, sulfamethoxazole-trimethoprim (BACTRIM DS,SEPTRA DS) 800-160 MG tablet  Hematuria, unspecified type - Plan: Urine Culture, sulfamethoxazole-trimethoprim (BACTRIM DS,SEPTRA DS) 800-160 MG tablet  History of nephrolithiasis - Plan: DG Abd 1 View  RLQ abdominal pain - Plan: POCT CBC   hemorrhagic cystitis vs. Less likely nephrolithiasis. Some CVA ttp, but no other signs of Pyelo.    -start Septra DS BID, check urine culture.   - consider CT if persistent symptoms to eval for stone, RTC/ER  precautions if worsening.   Meds ordered this encounter  Medications  . sulfamethoxazole-trimethoprim (BACTRIM DS,SEPTRA DS) 800-160 MG tablet    Sig: Take 1 tablet by mouth 2 (two) times daily.    Dispense:  20 tablet    Refill:  0   Patient Instructions   There was no apparent kidney stone seen on your x-ray, but that is still a possibility. I did check a urine culture, can start antibiotic for now. Depending on  culture results may call you to either stop the antibiotic or continue it.   If your symptoms are not improving in the next 3-4 days, would recommend return for recheck and possible imaging to rule out kidney stone or other testing as we discussed. Return to the clinic or go to the nearest emergency room if any of your symptoms worsen or new symptoms occur.   Hematuria, Adult Hematuria is blood in your urine. It can be caused by a bladder infection, kidney infection, prostate infection, kidney stone, or cancer of your urinary tract. Infections can usually be treated with medicine, and a kidney stone usually will pass through your urine. If neither of these is the cause of your hematuria, further workup to find out the reason may be needed. It is very important that you tell your health care provider about any blood  you see in your urine, even if the blood stops without treatment or happens without causing pain. Blood in your urine that happens and then stops and then happens again can be a symptom of a very serious condition. Also, pain is not a symptom in the initial stages of many urinary cancers. Follow these instructions at home:  Drink lots of fluid, 3-4 quarts a day. If you have been diagnosed with an infection, cranberry juice is especially recommended, in addition to large amounts of water.  Avoid caffeine, tea, and carbonated beverages because they tend to irritate the bladder.  Avoid alcohol because it may irritate the prostate.  Take all medicines as directed by your health care provider.  If you were prescribed an antibiotic medicine, finish it all even if you start to feel better.  If you have been diagnosed with a kidney stone, follow your health care provider's instructions regarding straining your urine to catch the stone.  Empty your bladder often. Avoid holding urine for long periods of time.  After a bowel movement, women should cleanse front to back. Use each tissue only once.  Empty your bladder before and after sexual intercourse if you are a female. Contact a health care provider if:  You develop back pain.  You have a fever.  You have a feeling of sickness in your stomach (nausea) or vomiting.  Your symptoms are not better in 3 days. Return sooner if you are getting worse. Get help right away if:  You develop severe vomiting and are unable to keep the medicine down.  You develop severe back or abdominal pain despite taking your medicines.  You begin passing a large amount of blood or clots in your urine.  You feel extremely weak or faint, or you pass out. This information is not intended to replace advice given to you by your health care provider. Make sure you discuss any questions you have with your health care provider. Document Released: 10/08/2005 Document  Revised: 03/15/2016 Document Reviewed: 06/08/2013 Elsevier Interactive Patient Education  2017 [employer]    IF you received an x-ray today, you will receive an invoice from [facility].  Please contact [facility] at [phone] with questions or concerns regarding your invoice.   IF you received labwork today, you will receive an invoice from [place]. Please contact [facility] at [phone] with questions or concerns regarding your invoice.   Our billing staff will not be able to assist you with questions regarding bills from these companies.  You will be contacted with the lab results as soon as they are available. The fastest way to get your results is to activate your My Chart account. Instructions are located on the last page of this paperwork. If you have not heard from [place] regarding the results in 2 weeks, please contact this office.      I personally performed the services described in this documentation, which was scribed in my presence. The recorded information has been reviewed and considered for accuracy and completeness, addended by me as needed, and agree with information above.  Signed,   Meredith Staggers, MD Primary Care at [facility].  01/01/18 10:08 PM

## 2017-12-30 NOTE — Patient Instructions (Addendum)
There was no apparent kidney stone seen on your x-ray, but that is still a possibility. I did check a urine culture, can start antibiotic for now. Depending on  culture results may call you to either stop the antibiotic or continue it.   If your symptoms are not improving in the next 3-4 days, would recommend return for recheck and possible imaging to rule out kidney stone or other testing as we discussed. Return to the clinic or go to the nearest emergency room if any of your symptoms worsen or new symptoms occur.   Hematuria, Adult Hematuria is blood in your urine. It can be caused by a bladder infection, kidney infection, prostate infection, kidney stone, or cancer of your urinary tract. Infections can usually be treated with medicine, and a kidney stone usually will pass through your urine. If neither of these is the cause of your hematuria, further workup to find out the reason may be needed. It is very important that you tell your health care provider about any blood you see in your urine, even if the blood stops without treatment or happens without causing pain. Blood in your urine that happens and then stops and then happens again can be a symptom of a very serious condition. Also, pain is not a symptom in the initial stages of many urinary cancers. Follow these instructions at home:  Drink lots of fluid, 3-4 quarts a day. If you have been diagnosed with an infection, cranberry juice is especially recommended, in addition to large amounts of water.  Avoid caffeine, tea, and carbonated beverages because they tend to irritate the bladder.  Avoid alcohol because it may irritate the prostate.  Take all medicines as directed by your health care provider.  If you were prescribed an antibiotic medicine, finish it all even if you start to feel better.  If you have been diagnosed with a kidney stone, follow your health care provider's instructions regarding straining your urine to catch the  stone.  Empty your bladder often. Avoid holding urine for long periods of time.  After a bowel movement, women should cleanse front to back. Use each tissue only once.  Empty your bladder before and after sexual intercourse if you are a female. Contact a health care provider if:  You develop back pain.  You have a fever.  You have a feeling of sickness in your stomach (nausea) or vomiting.  Your symptoms are not better in 3 days. Return sooner if you are getting worse. Get help right away if:  You develop severe vomiting and are unable to keep the medicine down.  You develop severe back or abdominal pain despite taking your medicines.  You begin passing a large amount of blood or clots in your urine.  You feel extremely weak or faint, or you pass out. This information is not intended to replace advice given to you by your health care provider. Make sure you discuss any questions you have with your health care provider. Document Released: 10/08/2005 Document Revised: 03/15/2016 Document Reviewed: 06/08/2013 Elsevier Interactive Patient Education  2017 [employer]    IF you received an x-ray today, you will receive an invoice from [facility]. Please contact [facility] at [phone] with questions or concerns regarding your invoice.   IF you received labwork today, you will receive an invoice from [facility]. Please contact [facility] at [phone] with questions or concerns regarding your invoice.   Our billing staff will not be able to assist you with questions  regarding bills from these companies.  You will be contacted with the lab results as soon as they are available. The fastest way to get your results is to activate your My Chart account. Instructions are located on the last page of this paperwork. If you have not heard from [place] regarding the results in 2 weeks, please contact this office.

## 2018-01-01 LAB — URINE CULTURE

## 2018-03-03 ENCOUNTER — Other Ambulatory Visit: Payer: Self-pay | Admitting: Chiropractor

## 2018-03-04 ENCOUNTER — Other Ambulatory Visit: Payer: Self-pay | Admitting: Chiropractor

## 2018-03-04 DIAGNOSIS — M5020 Other cervical disc displacement, unspecified cervical region: Secondary | ICD-10-CM

## 2018-03-09 ENCOUNTER — Other Ambulatory Visit: Payer: BLUE CROSS/BLUE SHIELD

## 2018-03-09 ENCOUNTER — Ambulatory Visit
Admission: RE | Admit: 2018-03-09 | Discharge: 2018-03-09 | Disposition: A | Discharge status: Home / Self Care | Payer: BLUE CROSS/BLUE SHIELD | Source: Ambulatory Visit | Attending: Chiropractor | Admitting: Chiropractor

## 2018-03-09 DIAGNOSIS — M5020 Other cervical disc displacement, unspecified cervical region: Secondary | ICD-10-CM

## 2018-04-07 ENCOUNTER — Ambulatory Visit: Payer: Self-pay | Admitting: Family Medicine

## 2018-04-07 NOTE — Progress Notes (Deleted)
No chief complaint on file.   HPI  4 review of systems  Past Medical History:  Diagnosis Date  . Hyperlipidemia   . Hypertension     Current Outpatient Medications  Medication Sig Dispense Refill  . carvedilol (COREG) 3.125 MG tablet Take 1 tablet (3.125 mg total) by mouth 2 (two) times daily with a meal. 180 tablet 90  . cetirizine (ZYRTEC) 10 MG tablet Take 1 tablet (10 mg total) by mouth daily. 90 tablet 3  . hydrochlorothiazide (HYDRODIURIL) 25 MG tablet TAKE 1 TABLET BY MOUTH ONCE DAILY 90 tablet 1  . rosuvastatin (CRESTOR) 20 MG tablet Take 1 tablet (20 mg total) by mouth daily. (Patient not taking: Reported on 12/30/2017) 90 tablet 1  . sulfamethoxazole-trimethoprim (BACTRIM DS,SEPTRA DS) 800-160 MG tablet Take 1 tablet by mouth 2 (two) times daily. 20 tablet 0   No current facility-administered medications for this visit.     Allergies: No Known Allergies  Past Surgical History:  Procedure Laterality Date  . CESAREAN SECTION    . COLON SURGERY      Social History   Socioeconomic History  . Marital status: Single    Spouse name: Not on file  . Number of children: Not on file  . Years of education: Not on file  . Highest education level: Not on file  Occupational History  . Not on file  Social Needs  . Financial resource strain: Not on file  . Food insecurity:    Worry: Not on file    Inability: Not on file  . Transportation needs:    Medical: Not on file    Non-medical: Not on file  Tobacco Use  . Smoking status: Current Every Day Smoker    Packs/day: 0.50    Years: 20.00    Pack years: 10.00    Types: Cigarettes  . Smokeless tobacco: Never Used  Substance and Sexual Activity  . Alcohol use: Yes    Alcohol/week: 0.6 oz    Types: 1 Glasses of wine per week  . Drug use: No  . Sexual activity: Not Currently  Lifestyle  . Physical activity:    Days per week: Not on file    Minutes per session: Not on file  . Stress: Not on file  Relationships    . Social connections:    Talks on phone: Not on file    Gets together: Not on file    Attends religious service: Not on file    Active member of club or organization: Not on file    Attends meetings of clubs or organizations: Not on file    Relationship status: Not on file  Other Topics Concern  . Not on file  Social History Narrative  . Not on file    Family History  Problem Relation Age of Onset  . Thyroid nodules Mother   . Hyperlipidemia Father   . Hypertension Father   . Gout Sister   . Diabetes Maternal Grandmother   . Hypertension Maternal Grandmother   . Stroke Maternal Grandfather   . Diabetes Paternal Grandmother   . Hypertension Paternal Grandmother   . Heart disease Paternal Grandfather      ROS Review of Systems See HPI Constitution: No fevers or chills No malaise No diaphoresis Skin: No rash or itching Eyes: no blurry vision, no double vision GU: no dysuria or hematuria Neuro: no dizziness or headaches * all others reviewed and negative   Objective: There were no vitals filed for this visit.  Physical Exam  Assessment and Plan There are no diagnoses linked to this encounter.   Monique Bowers [employer]

## 2018-05-29 ENCOUNTER — Other Ambulatory Visit: Payer: Self-pay | Admitting: Family Medicine

## 2018-07-02 ENCOUNTER — Ambulatory Visit (INDEPENDENT_AMBULATORY_CARE_PROVIDER_SITE_OTHER): Payer: BLUE CROSS/BLUE SHIELD | Admitting: Physician Assistant

## 2018-07-02 ENCOUNTER — Telehealth: Payer: Self-pay | Admitting: Physician Assistant

## 2018-07-02 ENCOUNTER — Encounter: Payer: Self-pay | Admitting: Physician Assistant

## 2018-07-02 ENCOUNTER — Other Ambulatory Visit: Payer: Self-pay

## 2018-07-02 ENCOUNTER — Ambulatory Visit: Payer: BLUE CROSS/BLUE SHIELD

## 2018-07-02 VITALS — BP 122/82 | HR 86 | Temp 99.0°F | Resp 16 | Ht 62.0 in | Wt 158.0 lb

## 2018-07-02 DIAGNOSIS — R300 Dysuria: Secondary | ICD-10-CM | POA: Diagnosis not present

## 2018-07-02 DIAGNOSIS — R109 Unspecified abdominal pain: Secondary | ICD-10-CM

## 2018-07-02 LAB — POCT URINALYSIS DIP (MANUAL ENTRY)
Bilirubin, UA: NEGATIVE
Blood, UA: NEGATIVE
Glucose, UA: NEGATIVE mg/dL
Ketones, POC UA: NEGATIVE mg/dL
Leukocytes, UA: NEGATIVE
Nitrite, UA: NEGATIVE
Protein Ur, POC: NEGATIVE mg/dL
Spec Grav, UA: <=1.005 — AB (ref 1.010–1.025)
Urobilinogen, UA: 0.2 U/dL
pH, UA: 6.5 (ref 5.0–8.0)

## 2018-07-02 LAB — POC MICROSCOPIC URINALYSIS (UMFC): Mucus: ABSENT

## 2018-07-02 NOTE — Patient Instructions (Signed)
° ° ° °  If you have lab work done today you will be contacted with your lab results within the next 2 weeks.  If you have not heard from us then please contact us. The fastest way to get your results is to register for My Chart. ° ° °IF you received an x-ray today, you will receive an invoice from [facility]. Please contact [facility] at [phone] with questions or concerns regarding your invoice.  ° °IF you received labwork today, you will receive an invoice from [facility]. Please contact [facility] at [phone] with questions or concerns regarding your invoice.  ° °Our billing staff will not be able to assist you with questions regarding bills from these companies. ° °You will be contacted with the lab results as soon as they are available. The fastest way to get your results is to activate your My Chart account. Instructions are located on the last page of this paperwork. If you have not heard from us regarding the results in 2 weeks, please contact this office. °  ° ° ° °

## 2018-07-02 NOTE — Progress Notes (Signed)
    SUBJECTIVE: Monique Bowers is a 37 y.o. female with history of nephrolithiasis who complains of urine odor and dysuria x 5 days. She first noticed the bad odor of her urine, then developed dysuria. Occasionally "feels like sand" when she urinates. Endorses right side pain which then developed into right sided lower abdominal pressure, about 3-4/10 on the pain scale. she has been drinking cranberry juice and taking AZO.   Denies  urinary frequency, urgency, fever, chills, flank pain, hematuria,  abnormal vaginal discharge or bleeding.   She was here 12/30/2017 c/o dysuria with a negative UA, culture grew E Coli. Treated with Bactrim.   OBJECTIVE: Appears well, in no apparent distress.  Vital signs are normal. The abdomen is soft with mild suprapubic tenderness. No guarding, mass, rebound or organomegaly. No CVA tenderness or inguinal adenopathy noted.    Results for orders placed or performed in visit on 07/02/18  POCT urinalysis dipstick  Result Value Ref Range   Color, UA yellow yellow   Clarity, UA clear clear   Glucose, UA negative negative mg/dL   Bilirubin, UA negative negative   Ketones, POC UA negative negative mg/dL   Spec Grav, UA <=2.951 (A) 1.010 - 1.025   Blood, UA negative negative   pH, UA 6.5 5.0 - 8.0   Protein Ur, POC negative negative mg/dL   Urobilinogen, UA 0.2 0.2 or 1.0 E.U./dL   Nitrite, UA Negative Negative   Leukocytes, UA Negative Negative  POCT Microscopic Urinalysis ([facility])  Result Value Ref Range   WBC,UR,HPF,POC None None WBC/hpf   RBC,UR,HPF,POC None None RBC/hpf   Bacteria None None, Too numerous to count   Mucus Absent Absent   Epithelial Cells, UR Per Microscopy None None, Too numerous to count cells/hpf    ASSESSMENT:  1. Dysuria 2. Right sided abdominal pain - POCT urinalysis dipstick - POCT Microscopic Urinalysis ([facility]) - Urine Culture No sign of UTI, pyelonephritis or hematuria which would suggest nephrolithiasis. Possible stone which is  now in bladder or already passed. Vitals are stable, not in distress. Urine culture is pending. Pt left in a hurry, about 20 min into OV, to get to her child's school before x-ray tech could get KUB. KUB was cancelled.   PLAN: Treatment per orders for possible stone in bladder which should pass in 24-48 hours - push fluids. Call or return to clinic prn if these symptoms worsen or fail to improve as anticipated. Informed pt of results via MyChart and MyChart personal message.   Marco Collie, PA-C  Primary Care at [facility] 07/02/2018 1:31 PM

## 2018-07-02 NOTE — Telephone Encounter (Signed)
Copied from CRM [id number]. Topic: Inquiry >> Jul 02, 2018  2:03 PM Windy Kalata, NT wrote: Reason for CRM: patient is calling and states she was supposed to get a xray today after her urine test but had to leave due to getting her daughter from school. Patient would just like a call back with the urine culture results.

## 2018-07-04 ENCOUNTER — Other Ambulatory Visit: Payer: Self-pay | Admitting: Physician Assistant

## 2018-07-04 DIAGNOSIS — N3000 Acute cystitis without hematuria: Secondary | ICD-10-CM

## 2018-07-04 LAB — URINE CULTURE

## 2018-07-04 MED ORDER — CIPROFLOXACIN HCL 500 MG PO TABS: 500.0000 mg | ORAL_TABLET | Freq: Two times a day (BID) | ORAL | 0 refills | Status: AC

## 2018-07-04 NOTE — Telephone Encounter (Signed)
Detailed message left

## 2018-07-04 NOTE — Progress Notes (Signed)
Please call pt and let her know her urine culture grew the bacteria, E Coli. I will send in an antibiotic to her pharmacy.  Thank you!

## 2018-12-02 ENCOUNTER — Other Ambulatory Visit: Payer: Self-pay | Admitting: Family Medicine

## 2018-12-02 NOTE — Telephone Encounter (Signed)
FYI to provider

## 2018-12-02 NOTE — Telephone Encounter (Signed)
Pt called back; she states that her insurance has changed to [employer] with [facility]; she is not sure if [place] accepts this insurance; instructed pt to contact her insurance to see who does accept this, but in the interim, will send refill request to see if it can be filled until she gets her new PCP ( see previous note dated 12/02/2018); she verbalizes understanding; the pt says that she can be reached at [phone] and messages can be left on that voicemail; will route to office for final disposition.

## 2018-12-02 NOTE — Telephone Encounter (Signed)
Refill request for hydrochlorthiazide; no valid encounter in the last 6 months; no upcoming visits noted; last refill 05/29/18; attempted to contact pt; left message on voicemail [phone]; will route to office for final disposition.  Requested medication (s) are due for refill today: yes  Requested medication (s) are on the active medication list: yes  Last refill:  05/29/18  Future visit scheduled: no  Notes to clinic:  No valid encounter in the last 6 months

## 2019-11-20 ENCOUNTER — Ambulatory Visit: Payer: BLUE CROSS/BLUE SHIELD | Attending: Internal Medicine

## 2019-11-20 DIAGNOSIS — Z20822 Contact with and (suspected) exposure to covid-19: Secondary | ICD-10-CM

## 2019-11-21 LAB — NOVEL CORONAVIRUS, NAA: SARS-CoV-2, NAA: NOT DETECTED

## 2020-04-07 IMAGING — MR MR CERVICAL SPINE W/O CM
4 of 5 series · 28 of 48 positions shown · non-contrast
Comparison: None.

CLINICAL DATA: Displacement of cervical intervertebral disc without
myelopathy. Neck pain

EXAM:
MRI CERVICAL SPINE WITHOUT CONTRAST
TECHNIQUE: Multiplanar, multisequence MR imaging of the cervical spine was
performed. No intravenous contrast was administered.

[Series 6: T1 · sagittal · 3.0mm · 0.66mm/px · 7 of 15 slices shown]
[im 1/15]
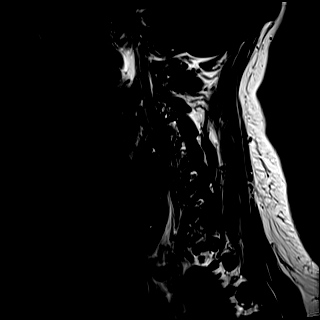
[im 3/15]
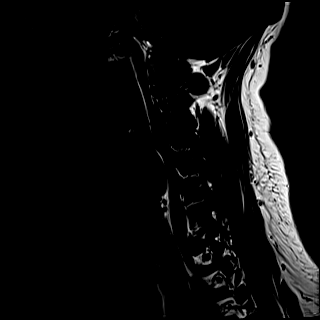
[im 5/15]
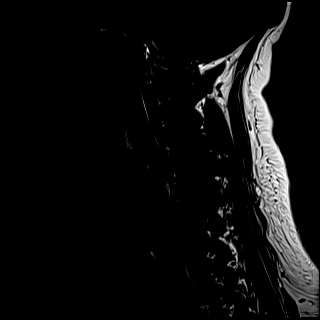
[im 8/15]
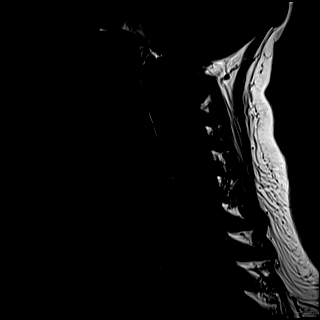
[im 10/15]
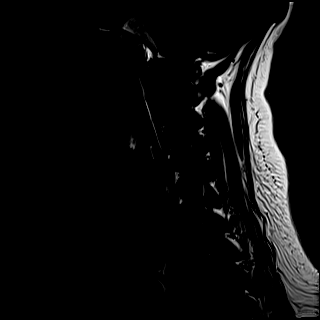
[im 12/15]
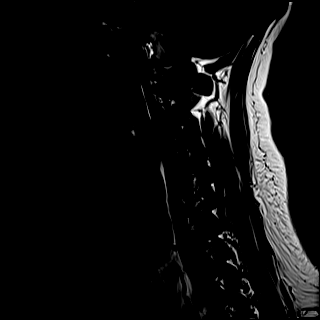
[im 15/15]
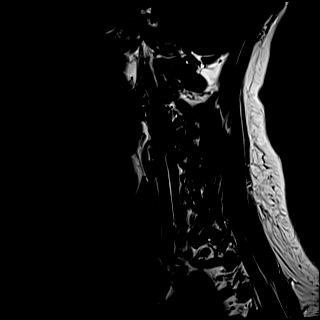

[Series 7: T2 · sagittal · 3.0mm · 0.55mm/px · 7 of 15 slices shown (1 of 2)]
[im 1/15]
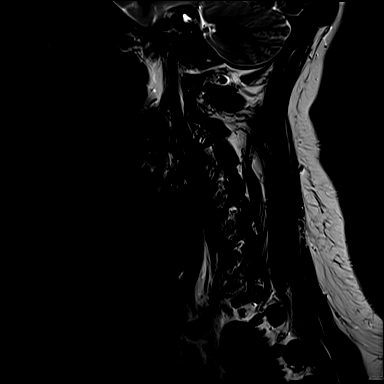
[im 3/15]
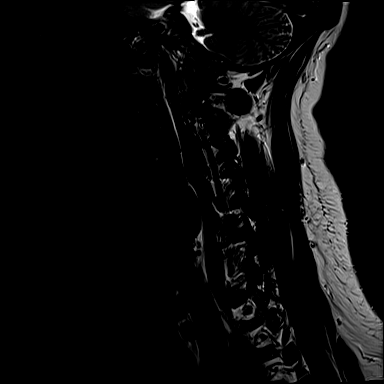
[im 5/15]
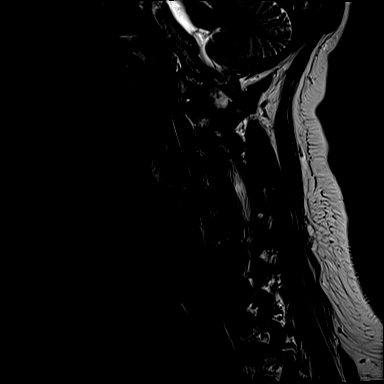
[im 8/15]
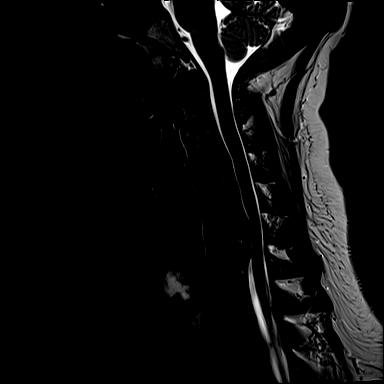
[im 10/15]
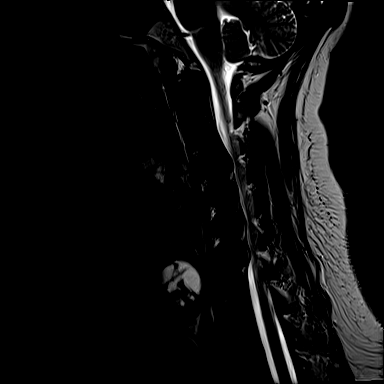
[im 12/15]
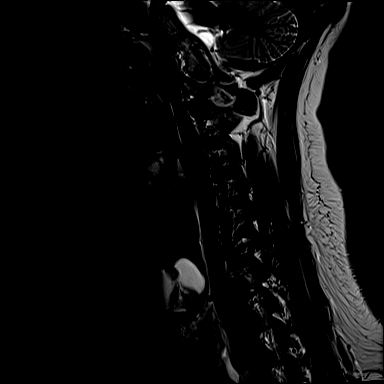
[im 15/15]
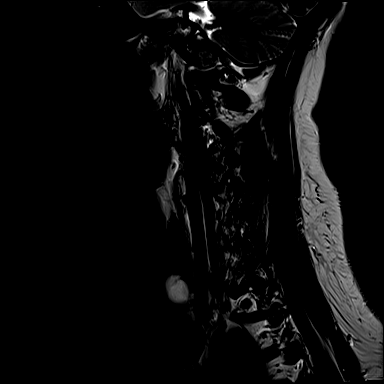

[Series 8: STIR · sagittal · 3.0mm · 0.33mm/px · 6 of 15 slices shown]
[im 1/15]
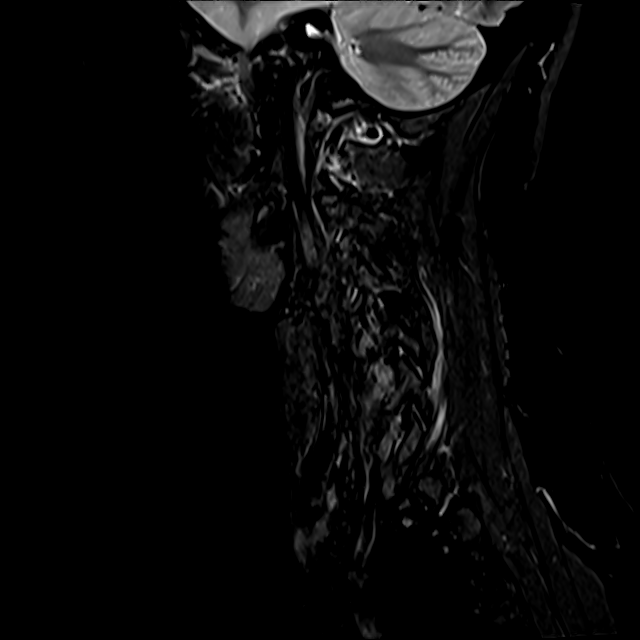
[im 3/15]
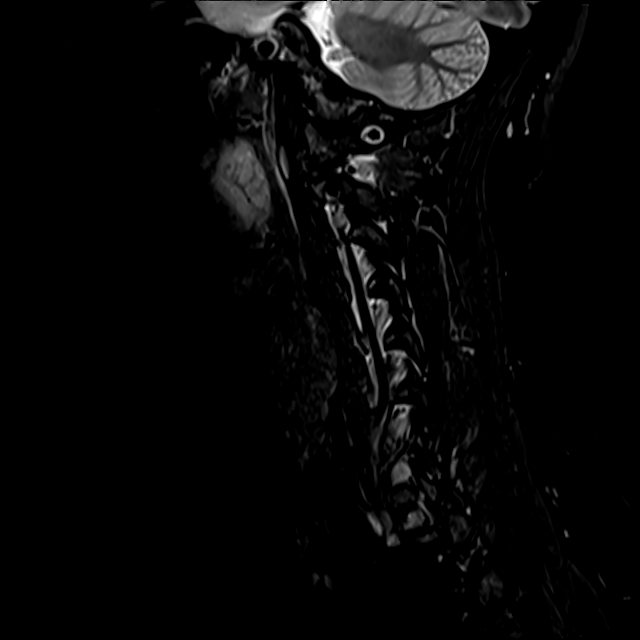
[im 6/15]
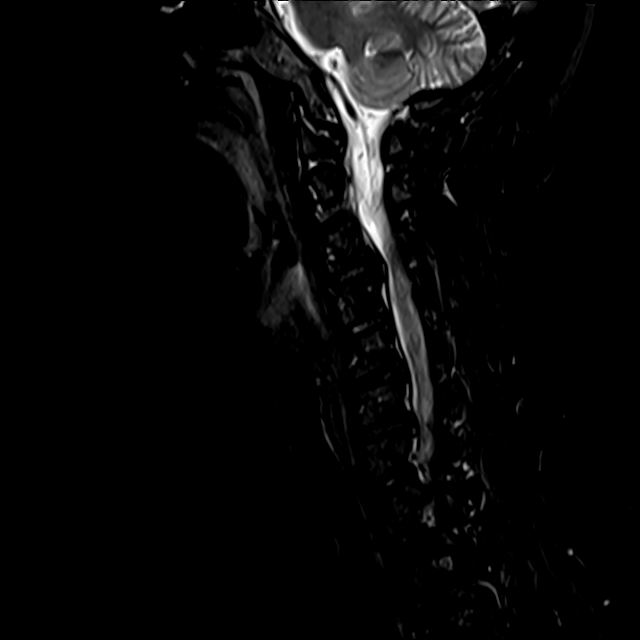
[im 9/15]
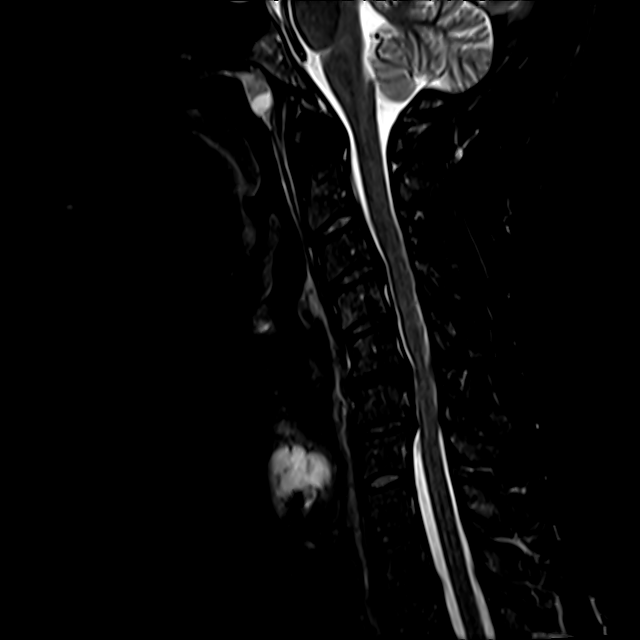
[im 12/15]
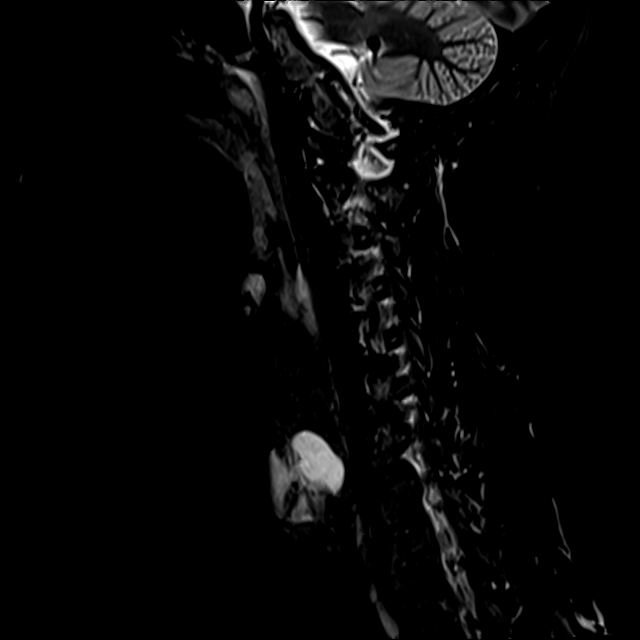
[im 15/15]
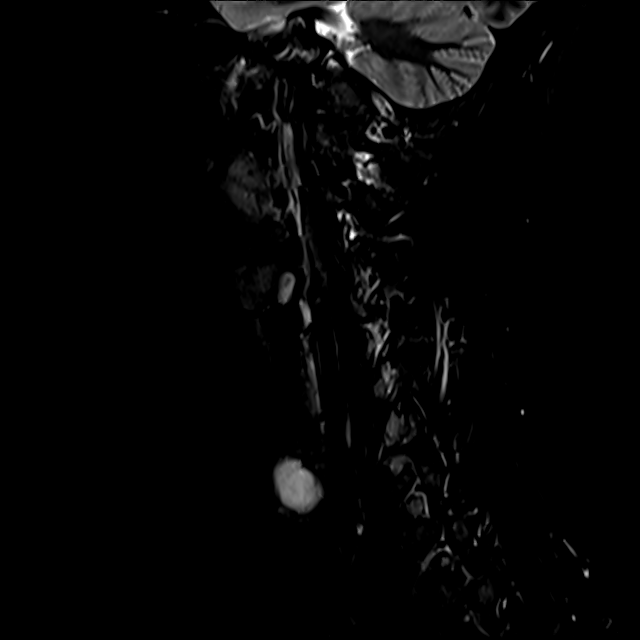

[Series 9: T2 · axial · 3.0mm · 0.50mm/px · z∈[-49,+59]mm · 8 of 34 slices shown (2 of 2)]
[im 1/34]
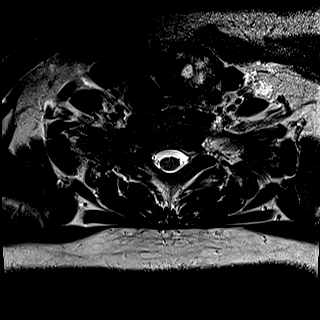
[im 6/34]
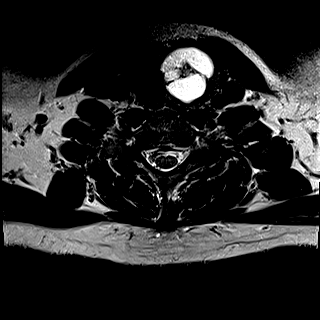
[im 11/34]
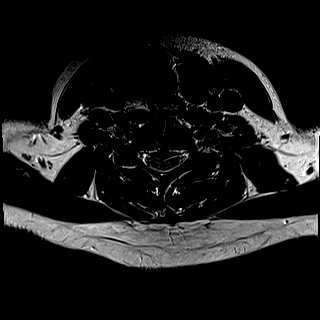
[im 16/34]
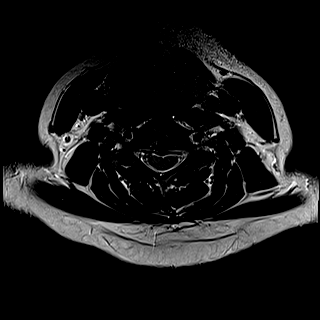
[im 18/34]
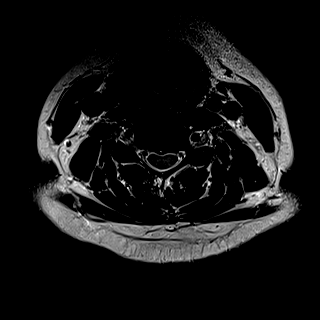
[im 23/34]
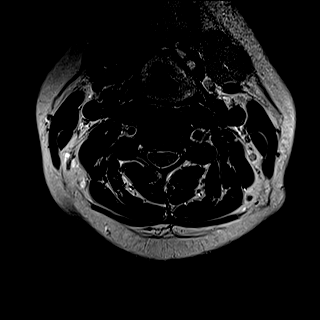
[im 28/34]
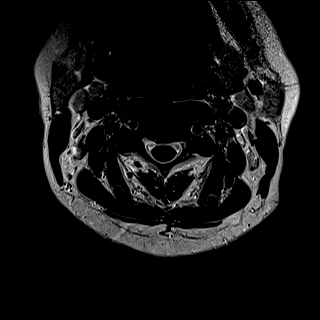
[im 34/34]
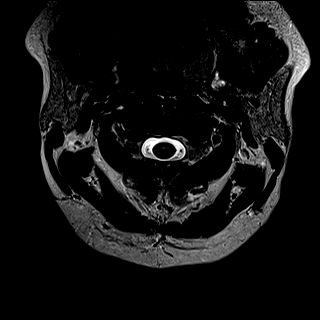

[28 of 48 positions shown; findings below may reference images not displayed]

FINDINGS: Alignment: Normal alignment.  Cervical kyphosis, moderate in degree.

Vertebrae: Negative for fracture or mass

Cord: Normal signal in the cord.  No cord lesion identified.

Posterior Fossa, vertebral arteries, paraspinal tissues: Cystic
nodule left thyroid measuring 29 mm in diameter.

1 cm level 2 lymph nodes in the neck bilaterally are not
pathologically enlarged.

Disc levels:

C2-3: Negative

C3-4: Mild disc space narrowing. Broad-based disc protrusion with
associated endplate spurring. Mild spinal stenosis and mild
foraminal stenosis bilaterally.

C4-5: Small central disc protrusion. Mild cord flattening and mild
spinal stenosis

C5-6: Disc degeneration with central disc protrusion and endplate
spurring. Cord flattening with moderate spinal stenosis. Neural
foramina adequately patent

C6-7: Disc degeneration with disc bulging and spondylosis. Cord
flattening. Mild to moderate spinal stenosis. Mild foraminal
stenosis bilaterally

C7-T1: Negative
IMPRESSION: Central disc protrusions C3-4 with mild spinal stenosis and mild
foraminal stenosis bilaterally.

Mild spinal stenosis C4-5 with small central disc protrusion

Moderate spinal stenosis C5-6 with central disc protrusion and
spurring.

Mild to moderate spinal stenosis at C6-7 with mild foraminal
narrowing bilaterally.
